# Patient Record
Sex: Male | Born: 1965 | Race: Black or African American | Hispanic: No | Marital: Single | Smoking: Current every day smoker
Health system: Southern US, Community
[De-identification: ages and names within clinical notes are randomized; demographics above are authoritative.]

## PROBLEM LIST (undated history)

## (undated) DIAGNOSIS — I10 Essential (primary) hypertension: Secondary | ICD-10-CM

---

## 1997-12-29 ENCOUNTER — Emergency Department (HOSPITAL_COMMUNITY): Admission: EM | Admit: 1997-12-29 | Discharge: 1997-12-29 | Payer: Self-pay | Admitting: Emergency Medicine

## 1998-08-21 ENCOUNTER — Encounter: Payer: Self-pay | Admitting: Emergency Medicine

## 1998-08-21 ENCOUNTER — Emergency Department (HOSPITAL_COMMUNITY): Admission: EM | Admit: 1998-08-21 | Discharge: 1998-08-21 | Payer: Self-pay | Admitting: Emergency Medicine

## 1998-09-19 ENCOUNTER — Emergency Department (HOSPITAL_COMMUNITY): Admission: EM | Admit: 1998-09-19 | Discharge: 1998-09-19 | Payer: Self-pay | Admitting: Emergency Medicine

## 1999-02-03 ENCOUNTER — Emergency Department (HOSPITAL_COMMUNITY): Admission: EM | Admit: 1999-02-03 | Discharge: 1999-02-03 | Payer: Self-pay | Admitting: Emergency Medicine

## 1999-06-10 ENCOUNTER — Emergency Department (HOSPITAL_COMMUNITY): Admission: EM | Admit: 1999-06-10 | Discharge: 1999-06-10 | Payer: Self-pay | Admitting: Emergency Medicine

## 1999-11-20 ENCOUNTER — Emergency Department (HOSPITAL_COMMUNITY): Admission: EM | Admit: 1999-11-20 | Discharge: 1999-11-20 | Payer: Self-pay | Admitting: Emergency Medicine

## 1999-11-26 ENCOUNTER — Emergency Department (HOSPITAL_COMMUNITY): Admission: EM | Admit: 1999-11-26 | Discharge: 1999-11-26 | Payer: Self-pay | Admitting: Emergency Medicine

## 2001-02-19 ENCOUNTER — Encounter: Payer: Self-pay | Admitting: Emergency Medicine

## 2001-02-19 ENCOUNTER — Emergency Department (HOSPITAL_COMMUNITY): Admission: EM | Admit: 2001-02-19 | Discharge: 2001-02-19 | Payer: Self-pay | Admitting: Emergency Medicine

## 2001-04-28 ENCOUNTER — Emergency Department (HOSPITAL_COMMUNITY): Admission: EM | Admit: 2001-04-28 | Discharge: 2001-04-28 | Payer: Self-pay

## 2004-01-02 ENCOUNTER — Emergency Department (HOSPITAL_COMMUNITY): Admission: EM | Admit: 2004-01-02 | Discharge: 2004-01-02 | Payer: Self-pay | Admitting: Emergency Medicine

## 2004-06-29 ENCOUNTER — Ambulatory Visit: Payer: Self-pay | Admitting: Nurse Practitioner

## 2004-07-29 ENCOUNTER — Ambulatory Visit: Payer: Self-pay | Admitting: *Deleted

## 2004-08-13 ENCOUNTER — Ambulatory Visit: Payer: Self-pay | Admitting: Nurse Practitioner

## 2004-08-24 ENCOUNTER — Ambulatory Visit: Payer: Self-pay | Admitting: Nurse Practitioner

## 2004-10-20 ENCOUNTER — Ambulatory Visit: Payer: Self-pay | Admitting: Nurse Practitioner

## 2004-10-28 ENCOUNTER — Ambulatory Visit: Payer: Self-pay | Admitting: Nurse Practitioner

## 2004-12-03 ENCOUNTER — Ambulatory Visit (HOSPITAL_COMMUNITY): Admission: RE | Admit: 2004-12-03 | Discharge: 2004-12-03 | Payer: Self-pay | Admitting: Internal Medicine

## 2004-12-16 ENCOUNTER — Ambulatory Visit: Payer: Self-pay | Admitting: Nurse Practitioner

## 2004-12-29 ENCOUNTER — Ambulatory Visit: Payer: Self-pay | Admitting: Nurse Practitioner

## 2004-12-30 ENCOUNTER — Ambulatory Visit (HOSPITAL_COMMUNITY): Admission: RE | Admit: 2004-12-30 | Discharge: 2004-12-30 | Payer: Self-pay | Admitting: Internal Medicine

## 2005-02-08 ENCOUNTER — Ambulatory Visit: Payer: Self-pay | Admitting: Nurse Practitioner

## 2005-04-12 ENCOUNTER — Ambulatory Visit: Payer: Self-pay | Admitting: Internal Medicine

## 2005-05-18 ENCOUNTER — Emergency Department (HOSPITAL_COMMUNITY): Admission: EM | Admit: 2005-05-18 | Discharge: 2005-05-18 | Payer: Self-pay | Admitting: Emergency Medicine

## 2005-06-14 ENCOUNTER — Ambulatory Visit: Payer: Self-pay | Admitting: Nurse Practitioner

## 2005-06-24 ENCOUNTER — Emergency Department (HOSPITAL_COMMUNITY): Admission: EM | Admit: 2005-06-24 | Discharge: 2005-06-24 | Payer: Self-pay | Admitting: Family Medicine

## 2005-08-11 ENCOUNTER — Emergency Department (HOSPITAL_COMMUNITY): Admission: EM | Admit: 2005-08-11 | Discharge: 2005-08-11 | Payer: Self-pay | Admitting: Family Medicine

## 2005-09-04 ENCOUNTER — Emergency Department (HOSPITAL_COMMUNITY): Admission: EM | Admit: 2005-09-04 | Discharge: 2005-09-04 | Payer: Self-pay | Admitting: Emergency Medicine

## 2005-10-07 ENCOUNTER — Emergency Department (HOSPITAL_COMMUNITY): Admission: EM | Admit: 2005-10-07 | Discharge: 2005-10-07 | Payer: Self-pay | Admitting: Family Medicine

## 2006-10-24 IMAGING — CR DG CHEST 2V
2 series · 2 of 2 positions shown · non-contrast
Comparison: none

CLINICAL DATA: Cough and shortness of breath

Chest 2 view:
No previous for comparison. Deformity of the right lateral thoracic cage. Lungs
clear. Heart size and pulmonary vascularity normal. No effusion.

[w chest pa]
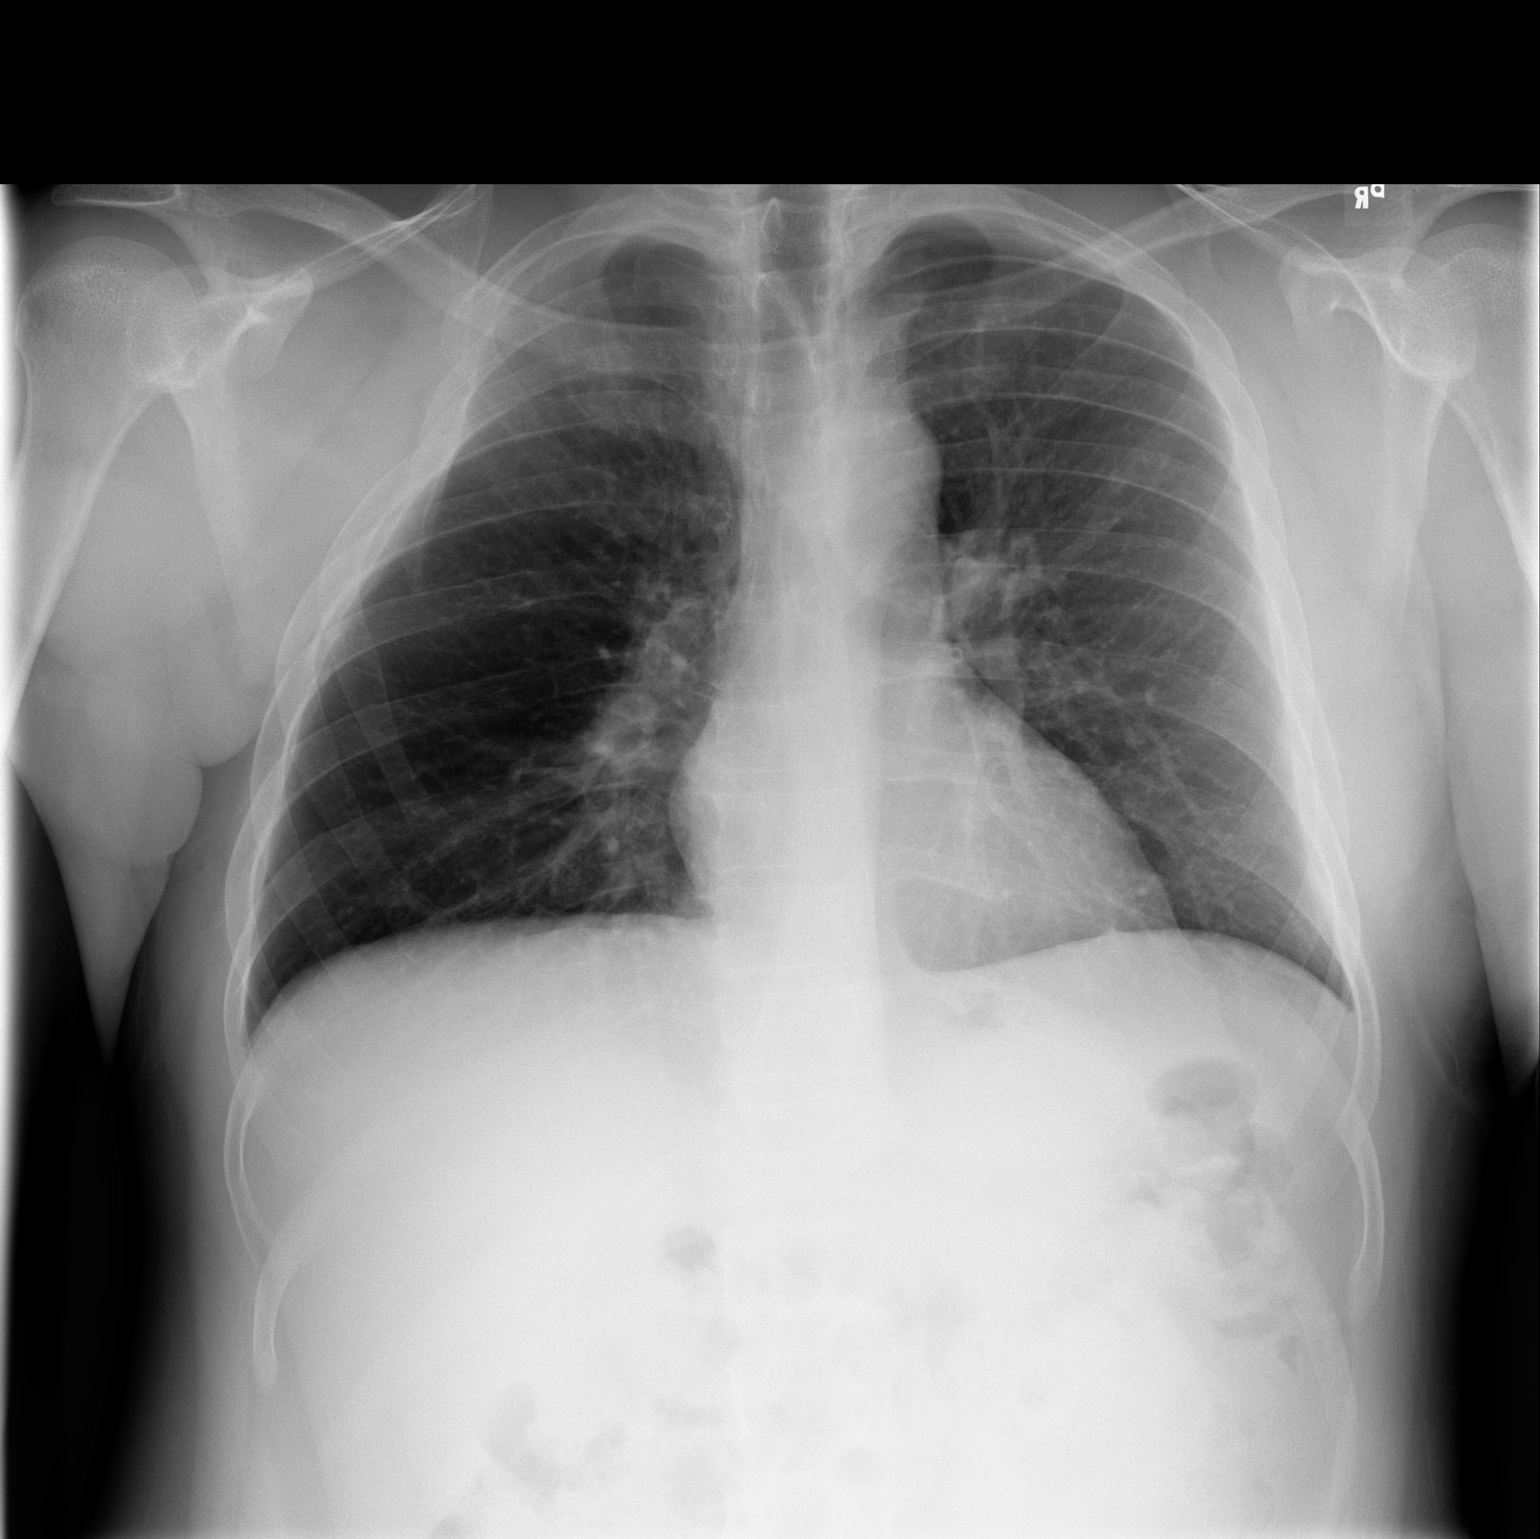

[w chest lat]
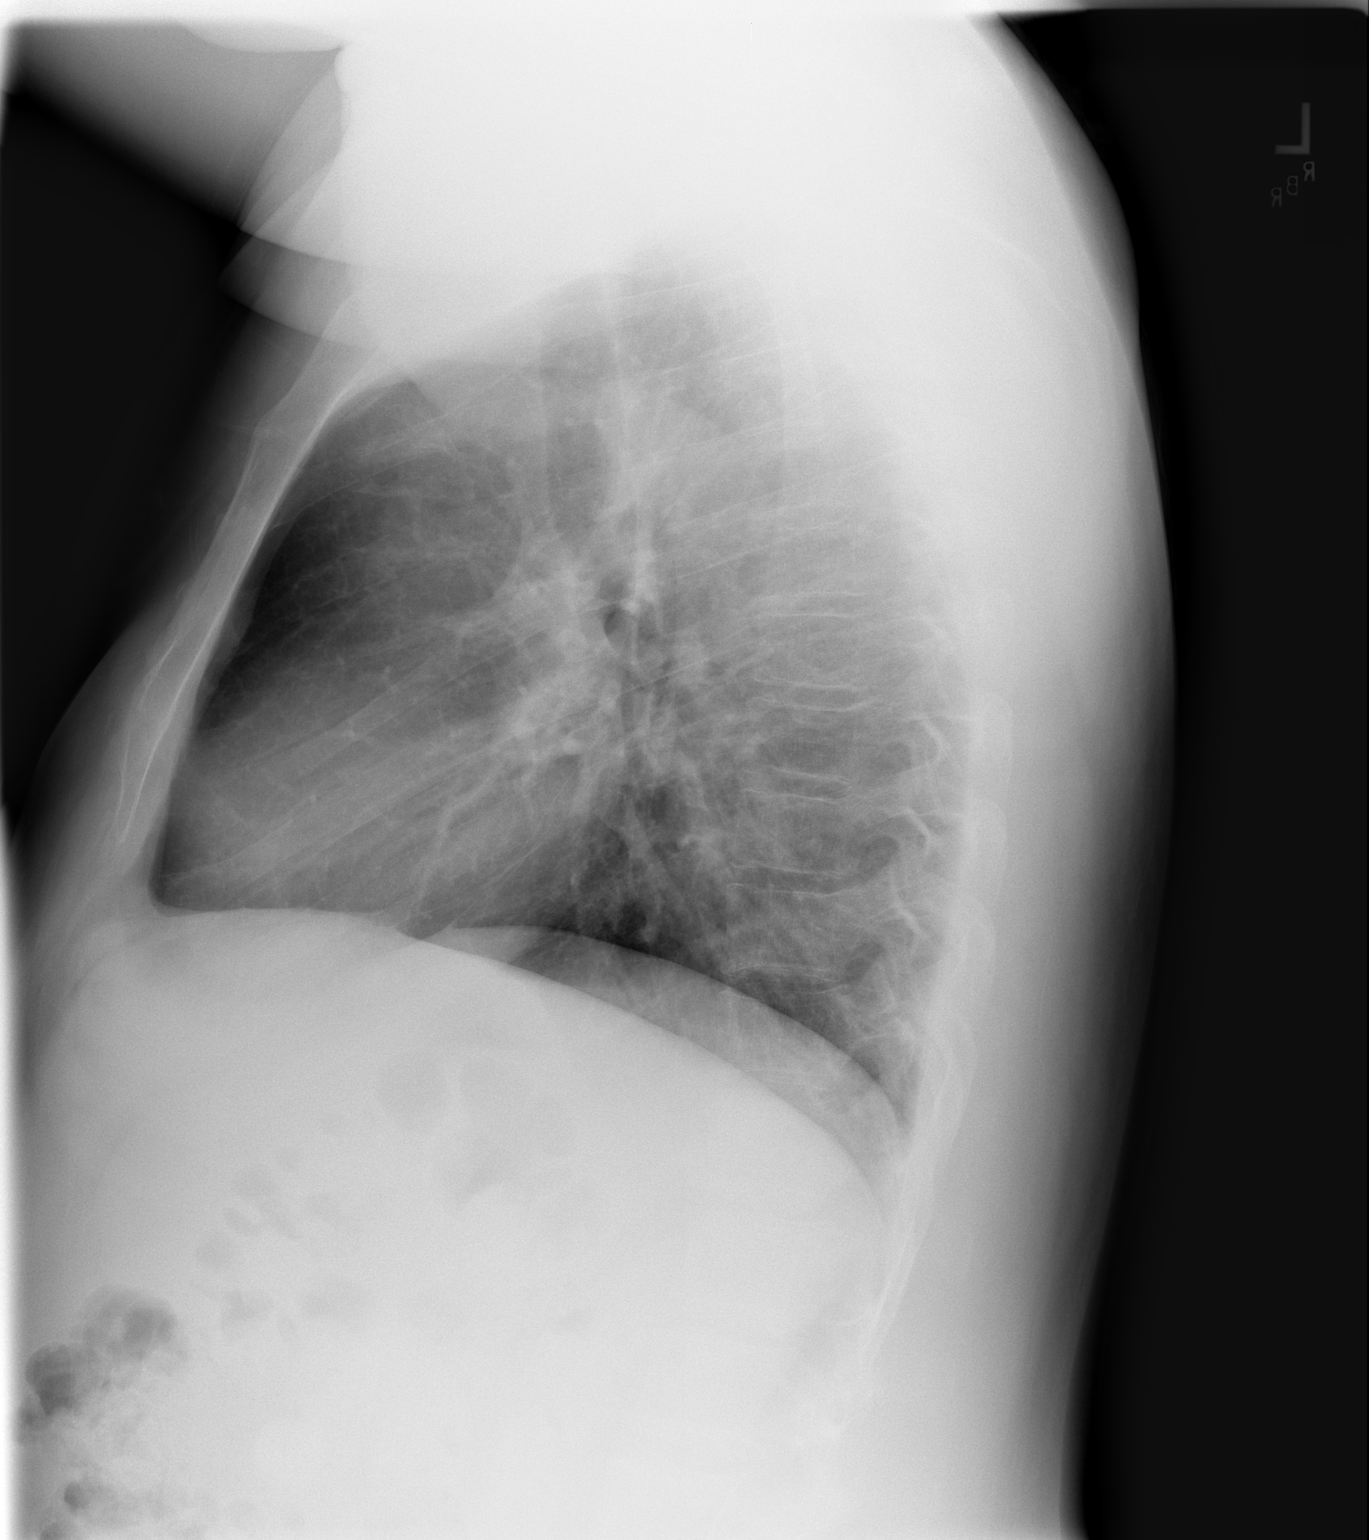

[2 of 2 positions shown; findings below may reference images not displayed]

IMPRESSION: 1. No acute disease

## 2007-09-23 ENCOUNTER — Emergency Department (HOSPITAL_COMMUNITY): Admission: EM | Admit: 2007-09-23 | Discharge: 2007-09-23 | Payer: Self-pay | Admitting: Family Medicine

## 2013-03-09 ENCOUNTER — Encounter (HOSPITAL_COMMUNITY): Payer: Self-pay | Admitting: *Deleted

## 2013-03-09 ENCOUNTER — Emergency Department (HOSPITAL_COMMUNITY)
Admission: EM | Admit: 2013-03-09 | Discharge: 2013-03-09 | Disposition: A | Discharge status: Home / Self Care | Payer: Self-pay | Attending: Emergency Medicine | Admitting: Emergency Medicine

## 2013-03-09 DIAGNOSIS — H109 Unspecified conjunctivitis: Secondary | ICD-10-CM | POA: Insufficient documentation

## 2013-03-09 DIAGNOSIS — F17200 Nicotine dependence, unspecified, uncomplicated: Secondary | ICD-10-CM | POA: Insufficient documentation

## 2013-03-09 MED ORDER — FLUORESCEIN SODIUM 1 MG OP STRP
1.0000 | ORAL_STRIP | Freq: Once | OPHTHALMIC | Status: DC
  Filled 2013-03-09: qty 1

## 2013-03-09 MED ORDER — ERYTHROMYCIN 5 MG/GM OP OINT: TOPICAL_OINTMENT | OPHTHALMIC | Status: DC

## 2013-03-09 MED ORDER — TETRACAINE HCL 0.5 % OP SOLN
1.0000 [drp] | Freq: Once | OPHTHALMIC | Status: AC
  Administered 2013-03-09: 1 [drp] via OPHTHALMIC
  Filled 2013-03-09: qty 2

## 2013-03-09 NOTE — ED Provider Notes (Signed)
CSN: [id number]     Arrival date & time 03/09/13  2140 History  This chart was scribed for non-physician practitioner, Mora Bellman, PA-C,working with Hurman Horn, MD, by Karle Plumber, ED Scribe.  This patient was seen in room TR06C/TR06C and the patient's care was started at 10:25 PM.    Chief Complaint  Patient presents with  . Conjunctivitis   The history is provided by the patient. No language interpreter was used.   HPI Comments:  Edward Golden is a 47 y.o. male who presents to the Emergency Department complaining of a red, irritated right eye onset three days. It has been gradually worsening. Pt states his left eye started experiencing the same symptoms today. Pt reports crusty drainage from both his eyes worse in the morning. He has not done anything to make the pain and itching better. He states it feels like he has rocks scraping his right eye. He does not wear glasses or contacts. No fevers, chills, double vision, blurry vision, floaters, pain with EOMs.   History reviewed. No pertinent past medical history. History reviewed. No pertinent past surgical history. History reviewed. No pertinent family history. History  Substance Use Topics  . Smoking status: Current Every Day Smoker  . Smokeless tobacco: Not on file  . Alcohol Use: Yes    Review of Systems  Eyes: Positive for discharge and redness. Negative for visual disturbance.  All other systems reviewed and are negative.    Allergies  Review of patient's allergies indicates no known allergies.  Home Medications  No current outpatient prescriptions on file. Triage Vitals: BP 169/97  Pulse 70  Temp(Src) 97.7 F (36.5 C) (Oral)  Resp 16  Ht 5\' 5"  (1.651 m)  Wt 177 lb 3 oz (80.372 kg)  BMI 29.49 kg/m2  SpO2 97% Physical Exam  Nursing note and vitals reviewed. Constitutional: He is oriented to person, place, and time. He appears well-developed and well-nourished. No distress.  HENT:  Head:  Normocephalic and atraumatic.  Right Ear: External ear normal.  Left Ear: External ear normal.  Nose: Nose normal.  Eyes: EOM and lids are normal. Pupils are equal, round, and reactive to light. Right conjunctiva is injected. Right conjunctiva has no hemorrhage. Left conjunctiva is injected. Left conjunctiva has no hemorrhage.  Slit lamp exam:      The right eye shows no corneal abrasion, no corneal flare, no corneal ulcer, no foreign body, no hyphema, no hypopyon and no fluorescein uptake.       The left eye shows no corneal abrasion, no corneal flare, no corneal ulcer, no foreign body, no hyphema, no hypopyon and no fluorescein uptake.  Injected conjunctivae bilaterally. No fluorescein dye uptake, no photophobia, no pain with consensual light reflex.   Neck: Normal range of motion. No tracheal deviation present.  Cardiovascular: Normal rate, regular rhythm and normal heart sounds.   Pulmonary/Chest: Effort normal and breath sounds normal. No stridor.  Abdominal: Soft. He exhibits no distension. There is no tenderness.  Musculoskeletal: Normal range of motion.  Neurological: He is alert and oriented to person, place, and time.  Skin: Skin is warm and dry. He is not diaphoretic.  Psychiatric: He has a normal mood and affect. His behavior is normal.    ED Course  Procedures (including critical care time) DIAGNOSTIC STUDIES: Oxygen Saturation is 97% on RA, normal by my interpretation.   COORDINATION OF CARE: 10:32 PM- Will check for corneal abrasion and treat for conjunctivitis. Pt verbalizes understanding and agrees  to plan.  Medications - No data to display  Labs Review Labs Reviewed - No data to display Imaging Review No results found.  MDM   1. Conjunctivitis    Patient presentation consistent with conjunctivitis.  No purulent discharge, corneal abrasions, entrapment, consensual photophobia, or dendritic staining with fluorescein study.  Presentation non-concerning for iritis,  corneal abrasions, or HSV.  Patient was discharged with erythromycin ointment.  Personal hygiene and frequent handwashing discussed.  Patient advised to followup with ophthalmologist if symptoms persist or worsen in any way including vision change or purulent discharge.  Patient verbalizes understanding and is agreeable with discharge.   I personally performed the services described in this documentation, which was scribed in my presence. The recorded information has been reviewed and is accurate.     Mora Bellman, PA-C 03/09/13 2357

## 2013-03-09 NOTE — ED Notes (Signed)
Pt states that his right eye was pink an irritated for 3 days, then it moved his left. Pt states crust and drainage as well. Pt states that he thought it was allergies but now believes that it has been too long for allergies.

## 2013-03-10 NOTE — ED Provider Notes (Signed)
Medical screening examination/treatment/procedure(s) were performed by non-physician practitioner and as supervising physician I was immediately available for consultation/collaboration.   Margo Lama M Petrice Beedy, MD 03/10/13 1237 

## 2013-10-17 ENCOUNTER — Emergency Department (HOSPITAL_COMMUNITY)
Admission: EM | Admit: 2013-10-17 | Discharge: 2013-10-17 | Disposition: A | Discharge status: Home / Self Care | Payer: Self-pay | Attending: Emergency Medicine | Admitting: Emergency Medicine

## 2013-10-17 DIAGNOSIS — I10 Essential (primary) hypertension: Secondary | ICD-10-CM | POA: Insufficient documentation

## 2013-10-17 DIAGNOSIS — H579 Unspecified disorder of eye and adnexa: Secondary | ICD-10-CM | POA: Insufficient documentation

## 2013-10-17 DIAGNOSIS — F17200 Nicotine dependence, unspecified, uncomplicated: Secondary | ICD-10-CM | POA: Insufficient documentation

## 2013-10-17 DIAGNOSIS — L259 Unspecified contact dermatitis, unspecified cause: Secondary | ICD-10-CM | POA: Insufficient documentation

## 2013-10-17 MED ORDER — DEXAMETHASONE SODIUM PHOSPHATE 10 MG/ML IJ SOLN
10.0000 mg | Freq: Once | INTRAMUSCULAR | Status: AC
  Administered 2013-10-17: 10 mg via INTRAMUSCULAR
  Filled 2013-10-17: qty 1

## 2013-10-17 MED ORDER — CLOBETASOL PROPIONATE 0.05 % EX CREA: 1.0000 "application " | TOPICAL_CREAM | Freq: Two times a day (BID) | CUTANEOUS | Status: AC

## 2013-10-17 MED ORDER — FLUORESCEIN SODIUM 1 MG OP STRP
1.0000 | ORAL_STRIP | Freq: Once | OPHTHALMIC | Status: AC
  Administered 2013-10-17: 1 via OPHTHALMIC
  Filled 2013-10-17: qty 1

## 2013-10-17 MED ORDER — TETRACAINE HCL 0.5 % OP SOLN
2.0000 [drp] | Freq: Once | OPHTHALMIC | Status: AC
  Administered 2013-10-17: 2 [drp] via OPHTHALMIC
  Filled 2013-10-17: qty 2

## 2013-10-17 MED ORDER — PREDNISONE 20 MG PO TABS: ORAL_TABLET | ORAL | Status: DC

## 2013-10-17 NOTE — ED Provider Notes (Signed)
CSN: [id number]     Arrival date & time 10/17/13  [id number] History   This chart was scribed for non-physician practitioner Dierdre ForthHannah Noboru Bidinger, PA-C, working with Hurman HornJohn M Bednar, MD, by Yevette EdwardsAngela Bracken, ED Scribe. This patient was seen in room TR05C/TR05C and the patient's care was started at 8:26 PM.  First MD Initiated Contact with Patient 10/17/13 2011     Chief Complaint  Patient presents with  . Rash  . Itchy Eye    The history is provided by the patient. No language interpreter was used.   HPI Comments Edward Golden is a 48 y.o. male who presents to the Emergency Department complaining of a rash to his face and bilateral arms which he first noticed two days ago. The pt works in [employer], and he states he may have been exposed to poison ivy. He states the rash began on his arms, and may have been transferred to his face via the handkerchief he utilizes at work. The pt characterizes the rash as "bumps." He also reports rhinorrhea and swelling to his eyes upon awakening, though he denies itching to his eyes. The pt has used Benadryl, coco butter, and shea butter without resolution. He denies a prior history of symptoms. He also denies fever, chills, SOB, and difficulty swallowing. He denies a h/o medical issues or daily medications.   No past medical history on file. No past surgical history on file. No family history on file. History  Substance Use Topics  . Smoking status: Current Every Day Smoker  . Smokeless tobacco: Not on file  . Alcohol Use: Yes    Review of Systems  Constitutional: Negative for fever and chills.  HENT: Positive for rhinorrhea. Negative for trouble swallowing.   Eyes: Negative for itching.  Respiratory: Negative for shortness of breath.   Skin: Positive for rash.  All other systems reviewed and are negative.   Allergies  Review of patient's allergies indicates no known allergies.  Home Medications   Prior to Admission medications   Medication Sig  Start Date End Date Taking? Authorizing Provider  erythromycin ophthalmic ointment Place a 1/2 inch ribbon of ointment into the lower eyelid. 03/09/13   Mora BellmanHannah S Merrell, PA-C   Triage Vitals: BP 174/109  Pulse 78  Temp(Src) 98.3 F (36.8 C) (Oral)  Resp 18  SpO2 98%  Physical Exam  Nursing note and vitals reviewed. Constitutional: He appears well-developed and well-nourished. No distress.  Awake, alert, nontoxic appearance  HENT:  Head: Normocephalic and atraumatic.  Mouth/Throat: Oropharynx is clear and moist. No oropharyngeal exudate.  Eyes: Conjunctivae are normal. No scleral icterus.  No flourescein uptake. No ulcers, abrasions, or dendritic lesions.   Neck: Normal range of motion. Neck supple.  Cardiovascular: Normal rate, regular rhythm and intact distal pulses.   Pulmonary/Chest: Effort normal and breath sounds normal. No respiratory distress. He has no wheezes.  Abdominal: Soft. Bowel sounds are normal. He exhibits no mass. There is no tenderness. There is no rebound and no guarding.  Musculoskeletal: Normal range of motion. He exhibits no edema.  Neurological: He is alert.  Speech is clear and goal oriented Moves extremities without ataxia  Skin: Skin is warm and dry. Rash noted. He is not diaphoretic. There is erythema.  Erythematous, macular, raised and excoriated rash to the face, forehead, nose, cheeks, bilateral neck and bilateral forearms; vesicles beginning to form and coalesce on the left flexural fold  No induration or evidence of abscess  Psychiatric: He has a normal mood and  affect.    ED Course  Procedures (including critical care time)  DIAGNOSTIC STUDIES: Oxygen Saturation is 98% on room air, normal by my interpretation.    COORDINATION OF CARE:  8:32 PM- Discussed treatment plan with patient, and the patient agreed to the plan. The plan includes a course of steroids, both oral and IM, and a fluorescein procedure.   9:12 PM- Performed fluorescein.    Labs Review Labs Reviewed - No data to display  Imaging Review No results found.   EKG Interpretation None      MDM   Final diagnoses:  Contact dermatitis  HTN (hypertension)   Edward Golden sense with rash to the face and arms consistent with contact dermatitis likely poison ivy.  No lesions to the globes, no evidence of spread into the eye.  Pt presentation consistant with poison ivy infection. Discussed contagiousness & home care. Treated in ED w decadron. DC with recommendation to get OTC Zanfel. Script for Clobetasol propionate given and PO prednisone taper. Strict return precautions discussed. Pt afebrile and in NAD prior to dc. Airway intact without compromise. No genital involvement of rash. facial involvement however no evidence of rash in the eyes.  Patient instructed to return to the emergency department if rash moves into his eyes, he develops fever or has difficulty breathing.  Patient noted to be hypertensive in the emergency department.  No signs of hypertensive urgency.  Discussed with patient the need for close follow-up and management by their primary care physician.   It has been determined that no acute conditions requiring further emergency intervention are present at this time. The patient/guardian have been advised of the diagnosis and plan. We have discussed signs and symptoms that warrant return to the ED, such as changes or worsening in symptoms.   Vital signs are stable at discharge.   BP 170/114  Pulse 64  Temp(Src) 98.3 F (36.8 C) (Oral)  Resp 16  SpO2 97%  Patient/guardian has voiced understanding and agreed to follow-up with the PCP or specialist.  I personally performed the services described in this documentation, which was scribed in my presence. The recorded information has been reviewed and is accurate.    Dahlia ClientHannah Paisleigh Maroney, PA-C 10/17/13 2146

## 2013-10-17 NOTE — Discharge Instructions (Signed)
1. Medications: clobetasol, prednisone, usual home medications 2. Treatment: rest, drink plenty of fluids,  3. Follow Up: Please followup with your primary doctor for discussion of your diagnoses and further evaluation after today's visit; if you do not have a primary care doctor use the resource guide provided to find one;    Poison [facility] Poison ivy is a inflammation of the skin (contact dermatitis) caused by touching the allergens on the leaves of the ivy plant following previous exposure to the plant. The rash usually appears 48 hours after exposure. The rash is usually bumps (papules) or blisters (vesicles) in a linear pattern. Depending on your own sensitivity, the rash may simply cause redness and itching, or it may also progress to blisters which may break open. These must be well cared for to prevent secondary bacterial (germ) infection, followed by scarring. Keep any open areas dry, clean, dressed, and covered with an antibacterial ointment if needed. The eyes may also get puffy. The puffiness is worst in the morning and gets better as the day progresses. This dermatitis usually heals without scarring, within 2 to 3 weeks without treatment. HOME CARE INSTRUCTIONS  Thoroughly wash with soap and water as soon as you have been exposed to poison ivy. You have about one half hour to remove the plant resin before it will cause the rash. This washing will destroy the oil or antigen on the skin that is causing, or will cause, the rash. Be sure to wash under your fingernails as any plant resin there will continue to spread the rash. Do not rub skin vigorously when washing affected area. Poison ivy cannot spread if no oil from the plant remains on your body. A rash that has progressed to weeping sores will not spread the rash unless you have not washed thoroughly. It is also important to wash any clothes you have been wearing as these may carry active allergens. The rash will return if you wear the unwashed  clothing, even several days later. Avoidance of the plant in the future is the best measure. Poison ivy plant can be recognized by the number of leaves. Generally, poison ivy has three leaves with flowering branches on a single stem. Diphenhydramine may be purchased over the counter and used as needed for itching. Do not drive with this medication if it makes you drowsy.Ask your caregiver about medication for children. SEEK MEDICAL CARE IF:  Open sores develop.  Redness spreads beyond area of rash.  You notice purulent (pus-like) discharge.  You have increased pain.  Other signs of infection develop (such as fever). Document Released: 05/20/2000 Document Revised: 08/15/2011 Document Reviewed: 04/08/2009 [facility] Patient Information 2014 [employer].    Emergency Department Resource Guide 1) Find a Doctor and Pay Out of Pocket Although you won't have to find out who is covered by your insurance plan, it is a good idea to ask around and get recommendations. You will then need to call the office and see if the doctor you have chosen will accept you as a new patient and what types of options they offer for patients who are self-pay. Some doctors offer discounts or will set up payment plans for their patients who do not have insurance, but you will need to ask so you aren't surprised when you get to your appointment.  2) Contact Your Local Health Department Not all health departments have doctors that can see patients for sick visits, but many do, so it is worth a call to see if yours  does. If you don't know where your local health department is, you can check in your phone book. The CDC also has a tool to help you locate your state's health department, and many state websites also have listings of all of their local health departments.  3) Find a Walk-in Clinic If your illness is not likely to be very severe or complicated, you may want to try a walk in clinic. These are popping up all  over the country in pharmacies, drugstores, and shopping centers. They're usually staffed by nurse practitioners or physician assistants that have been trained to treat common illnesses and complaints. They're usually fairly quick and inexpensive. However, if you have serious medical issues or chronic medical problems, these are probably not your best option.  No Primary Care Doctor: - Call Health Connect at  [phone] - they can help you locate a primary care doctor that  accepts your insurance, provides certain services, etc. - Physician Referral Service- [phone]  Chronic Pain Problems: Organization         Address  Phone   Notes  [facility]  [phone]) [phone] Patients need to be referred by their primary care doctor.   Medication Assistance: Organization         Address  Phone   Notes  [facility] Medication [facility] [address] [place], [place] [address] [phone]) [phone] --Must be a resident of [place] -- Must have NO insurance coverage whatsoever (no Medicaid/ Medicare, etc.) -- The pt. MUST have a primary care doctor that directs their care regularly and follows them in the community   [facility]ist  [phone]   [employer]  [phone]) [phone]    Agencies that provide inexpensive medical care: Organization         Address  Phone   Notes  [facility]  [phone]   [facility]    [phone]) [phone]   [facility] [address] [place], [place] [address] [phone]   [facility] [address], [place] [phone]) [phone]   [facility]anned Parenthood    [phone]) [phone]   [facility]    [phone](336) [phone]   [facility]  [address], [place] Phone:  [phone], Fax:  [phone] Hours of Operation:  9 am - 6 pm, M-F.  Also accepts Medicaid/Medicare and self-pay.  [facility]  [address], [place] Phone: [phone](336) [phone], Fax: [phone]. Hours of Operation:  8:30 am - 5:30 pm, M-F.  Also accepts Medicaid and self-pay.  [facility] [place] [address] [place] [address], [place] Phone: [phone]) [phone]   [facility] [address], [place], [place] [phone], Ext. [phone] Mondays & Thursdays: 7-9 AM.  First 15 patients are seen on a first come, first serve basis.    Medicaid-accepting [facility] Providers:  Organization         Address  Phone   Notes  [facility] [address], [place] [phone]) [phone] Also accepts self-pay patients.  [facility] [address], [place]  [phone]) [phone]   [facility] [address], [place] [phone]) [phone]   [facility] [address] [place] [address], [place] [phone]) [phone]   Renaye RakersVeita Bland [address], [place]   [phone]  Only accepts [place] patients after they have their name applied to their card.   Self-Pay (no insurance) in [facility]:  Organization         Address  Phone   Notes  Sickle Cell Patients, [facility] [address] [place], [place] [phone]   [facility] [address] [place], [place] [phone]   [facility] [place]  [address], [place] [phone]   [facility]/Dr. Osei-Bonsu  [address] [place] or [address], [place] [phone] Phone number for both [place] and [place] locations is the same.  [facility] [address] [place] [phone]   [facility] [address] [place] or [address] [phone]   [facility] [address], [place] [phone], phone; [phone], fax Sees patients 1st and 3rd Saturday of every  month.  Must not qualify for public or private insurance (i.e. Medicaid, Medicare, [place] Health Choice, Veterans' Benefits)  Household income should be no more than 200% of the poverty level The clinic cannot treat you if you are pregnant or think you are pregnant  Sexually transmitted diseases are not treated at the clinic.    Dental Care: Organization         Address  Phone  Notes  [facility] [address] [place], [place] [phone] Accepts children up to age 23 who are enrolled in [place] or [place] Health Choice; pregnant women with a Medicaid card; and children who have applied for Medicaid or [place] Health Choice, but were declined, whose parents can pay a reduced fee at time of service.  [facility]  [address], [place] [phone] Accepts children up to age 1 who are enrolled in [place] or [place] Health Choice; pregnant women with a Medicaid card; and children who have applied for Medicaid or  Health Choice, but were declined, whose parents can pay a reduced fee at time of service.  [facility]  [address] [place], [place] [phone] Patients are seen by appointment only. Walk-ins are not accepted. [facility] will see patients 18 years of age and older. Monday - Tuesday (8am-5pm) Most Wednesdays (8:30-5pm) $30 per visit, cash only  [facility]  [address], [facility] [phone] Patients are seen by appointment only. Walk-ins are not accepted. [facility] will see patients 79 years of age and older. One Wednesday Evening (Monthly: Volunteer Based).  $30 per visit, cash only  [employer]  [phone] for adults; Children under age 39, call Graduate Pediatric Dentistry at [phone]. Children aged 10-14, please call [phone] to request a pediatric application.  Dental  services are provided in all areas of dental care including fillings, crowns and bridges, complete and partial dentures, implants, gum treatment, root canals, and extractions. Preventive care is also provided. Treatment is provided to both adults and children. Patients are selected via a lottery and there is often a waiting list.   [facility] [address] [place]  [phone] [email or web address]   [facility] [address] [place], [place] [phone], Ext. [phone] Second and Fourth Thursday of each month, opens at 6:30 AM; Clinic  ends at 9 AM.  Patients are seen on a first-come first-served basis, and a limited number are seen during each clinic.   [facility]  [address] Ether Griffins [place], [place] [phone]   Eligibility Requirements You must have lived in [place], [place], or [place] counties for at least the last three months.   You cannot be eligible for state or federal sponsored [place], including [employer], [place], or [employer].   You generally cannot be eligible for healthcare insurance through your employer.    How to apply: Eligibility screenings are held every Tuesday and Wednesday afternoon from 1:00 pm until 4:00 pm. You do not need an appointment for the interview!  [facility] [address] [place], [place] [phone]   [facility]  [phone]   [facility]  [phone]   [facility] Health Department  [phone]    Behavioral Health Resources in the Community: Intensive Outpatient Programs Organization         Address  Phone  Notes  [facility] [address], [place], [place] [phone]   [facility] [address], [place], [place] [phone]   [facility] [address], [place], [place]  [phone]   [facility] [address],    [place], [place] [phone] or [phone]   Substance Abuse Resources Organization         Address  Phone  Notes  [facility]  [phone]   [facility]  [phone]   [facility]  [phone]   Floydene Flock  [phone]   Residential & Outpatient Substance Abuse Program  [phone]   Psychological Services Organization         Address  Phone  Notes  [facility]  [phone]   [facility]  [phone]   [facility] Mental Health [address], [place] [phone] or [phone]    Mobile Crisis Teams Organization         Address  Phone  Notes  [facility], Mobile Crisis Care Unit  [phone]   [facility]  [address] [place], [place] [phone]   Doristine Locks [address] [place] [phone]    Self-Help/Support Groups Organization         Address  Phone             Notes  [facility] [place] - variety of support groups  336- I7437963 Call for more information  Narcotics Anonymous (NA), [facility] [address], [employer] [place]  2 meetings at this location   Statistician         Address  Phone  Notes  [facility] [address],    [place]  [phone]   [facility]  [address] [place] [address], [place], [place] [phone]   [facility] [address] [place], [place] [phone] Admissions: 8am-3pm M-F  [facility] [address],    [place], [place] [phone]   [facility] [address] [place]s [place], [place] [phone]   [facility] [address],  [place], [place] [phone]   [facility] - Intensive Outpatient [address], [place], [place] [phone]   [facility] ([facility] [address] [place], [place]  [phone] or [phone]  [facility] ([facility]) [address], [place], [place] [phone] Accepts Medicaid  [facility] [address]  [place] [phone] Substance Abuse/Addiction Treatment   [facility] Organization         Address  Phone  Notes  [facility]  [phone]   Angie Fava, PhD [address] Ervin Knack [place], [place]   [phone] or [phone]   [facility]   [address] [place], [place] [phone]   [facility] [address] [place], [place] [phone] Insurance/Medicaid/sponsorship through [facility] and Families [address]                                    [place], [place] [phone] Therapy/tele-psych/case  [facility] [address], [place] [phone]    Dr. Lolly Mustache  [phone]   [facility] [place]  [employer] [facility] Dept. 1) [address] [place] 2) [address] [place] 3)  [address], [place] [phone]  [phone]   [facility] Child Abuse Hotline (201)100-4376 or [phone] (After Hours)

## 2013-10-17 NOTE — ED Notes (Signed)
Pt c/o rash on his arms and face. Pt states he works [employer] and may have been around poison ivy.

## 2013-10-17 NOTE — ED Notes (Signed)
MD cleared patient to go with blood pressures, pt to follow up with primary care.

## 2013-10-17 NOTE — ED Notes (Addendum)
Pt states he woke up with runny nose and rash after working outside in the yard X 2days now. Rash to face and bilateral arms.

## 2013-10-18 NOTE — ED Provider Notes (Signed)
Medical screening examination/treatment/procedure(s) were performed by non-physician practitioner and as supervising physician I was immediately available for consultation/collaboration.   EKG Interpretation None       Hurman HornJohn M Athony Coppa, MD 10/18/13 2115

## 2014-11-13 ENCOUNTER — Emergency Department (HOSPITAL_COMMUNITY)
Admission: EM | Admit: 2014-11-13 | Discharge: 2014-11-13 | Disposition: A | Discharge status: Home / Self Care | Payer: Self-pay | Attending: Emergency Medicine | Admitting: Emergency Medicine

## 2014-11-13 ENCOUNTER — Encounter (HOSPITAL_COMMUNITY): Payer: Self-pay | Admitting: *Deleted

## 2014-11-13 DIAGNOSIS — Z720 Tobacco use: Secondary | ICD-10-CM | POA: Insufficient documentation

## 2014-11-13 DIAGNOSIS — I10 Essential (primary) hypertension: Secondary | ICD-10-CM | POA: Insufficient documentation

## 2014-11-13 HISTORY — DX: Essential (primary) hypertension: I10

## 2014-11-13 LAB — I-STAT CHEM 8, ED
BUN: 17 mg/dL (ref 6–20)
CALCIUM ION: 1.07 mmol/L — AB (ref 1.12–1.23)
Chloride: 102 mmol/L (ref 101–111)
Creatinine, Ser: 1.1 mg/dL (ref 0.61–1.24)
Glucose, Bld: 108 mg/dL — ABNORMAL HIGH (ref 65–99)
HCT: 43 % (ref 39.0–52.0)
Hemoglobin: 14.6 g/dL (ref 13.0–17.0)
Potassium: 3.4 mmol/L — ABNORMAL LOW (ref 3.5–5.1)
SODIUM: 137 mmol/L (ref 135–145)
TCO2: 20 mmol/L (ref 0–100)

## 2014-11-13 MED ORDER — HYDROCHLOROTHIAZIDE 25 MG PO TABS: 25.0000 mg | ORAL_TABLET | Freq: Every day | ORAL | Status: AC

## 2014-11-13 NOTE — ED Provider Notes (Addendum)
CSN: [id number]     Arrival date & time 11/13/14  0831 History   First MD Initiated Contact with Patient 11/13/14 [phone]     Chief Complaint  Patient presents with  . Hypertension     (Consider location/radiation/quality/duration/timing/severity/associated sxs/prior Treatment) Patient is a 49 y.o. male presenting with hypertension. The history is provided by the patient.  Hypertension This is a recurrent problem. Episode onset: 6 months. The problem occurs constantly. The problem has been gradually worsening. Pertinent negatives include no chest pain, no abdominal pain and no shortness of breath. Associated symptoms comments: occassional head pressure but not today. Nothing aggravates the symptoms. Nothing relieves the symptoms. He has tried nothing (was on HCTZ about 10 years ago but BP improved after loosing weight) for the symptoms. The treatment provided no relief.    Past Medical History  Diagnosis Date  . Hypertension    History reviewed. No pertinent past surgical history. No family history on file. History  Substance Use Topics  . Smoking status: Current Every Day Smoker -- 0.50 packs/day    Types: Cigarettes  . Smokeless tobacco: Not on file  . Alcohol Use: Yes    Review of Systems  Respiratory: Negative for shortness of breath.   Cardiovascular: Negative for chest pain.  Gastrointestinal: Negative for abdominal pain.  All other systems reviewed and are negative.     Allergies  Review of patient's allergies indicates no known allergies.  Home Medications   Prior to Admission medications   Medication Sig Start Date End Date Taking? Authorizing Provider  clobetasol cream (TEMOVATE) 0.05 % Apply 1 application topically 2 (two) times daily. Patient not taking: Reported on 11/13/2014 10/17/13   Dahlia Client Muthersbaugh, PA-C  predniSONE (DELTASONE) 20 MG tablet 3 tabs po daily x 3 days, then 2 tabs x 3 days, then 1.5 tabs x 3 days, then 1 tab x 3 days, then 0.5 tabs x 3  days Patient not taking: Reported on 11/13/2014 10/17/13   Dahlia Client Muthersbaugh, PA-C   BP 179/127 mmHg  Pulse 61  Temp(Src) 98 F (36.7 C) (Oral)  Resp 16  Ht 5' 5.5" (1.664 m)  Wt 178 lb (80.74 kg)  BMI 29.16 kg/m2  SpO2 99% Physical Exam  Constitutional: He is oriented to person, place, and time. He appears well-developed and well-nourished. No distress.  HENT:  Head: Normocephalic and atraumatic.  Mouth/Throat: Oropharynx is clear and moist.  Eyes: Conjunctivae and EOM are normal. Pupils are equal, round, and reactive to light.  Neck: Normal range of motion. Neck supple.  Cardiovascular: Normal rate, regular rhythm and intact distal pulses.   No murmur heard. Pulmonary/Chest: Effort normal and breath sounds normal. No respiratory distress. He has no wheezes. He has no rales.  Abdominal: Soft. He exhibits no distension. There is no tenderness. There is no rebound and no guarding.  Musculoskeletal: Normal range of motion. He exhibits no edema or tenderness.  Neurological: He is alert and oriented to person, place, and time.  Skin: Skin is warm and dry. No rash noted. No erythema.  Psychiatric: He has a normal mood and affect. His behavior is normal.  Nursing note and vitals reviewed.   ED Course  Procedures (including critical care time) Labs Review Labs Reviewed  I-STAT CHEM 8, ED - Abnormal; Notable for the following:    Potassium 3.4 (*)    Glucose, Bld 108 (*)    Calcium, Ion 1.07 (*)    All other components within normal limits    Imaging Review  No results found.   EKG Interpretation None      MDM   Final diagnoses:  Essential hypertension    Patient presents today with asymptomatic hypertension. History of hypertension approximately 10 years ago for which he took HCTZ but states he lost a significant amount of weight and his blood pressure improved. For the last 6 months he's been checking his blood pressure at his dad's house as well as when he tries to  donate plasma and it is remained elevated. He denies any chest pain or shortness of breath. Occasionally he will get pressure in his head but denies true headache. No focal weakness.  Blood pressure today was 179/127 and on repeat was 152/117. Discussed with patient a low-salt diet, smoke cessation and will start HCTZ if i-STAT Chem-8 is within normal limits.  10:09 AM Istat wnl.  Pt d/ced home with hctz  Gwyneth Sprout, MD 11/13/14 1010  Gwyneth Sprout, MD 11/13/14 1012

## 2014-11-13 NOTE — Discharge Instructions (Signed)
Hypertension Hypertension, commonly called high blood pressure, is when the force of blood pumping through your arteries is too strong. Your arteries are the blood vessels that carry blood from your heart throughout your body. A blood pressure reading consists of a higher number over a lower number, such as 110/72. The higher number (systolic) is the pressure inside your arteries when your heart pumps. The lower number (diastolic) is the pressure inside your arteries when your heart relaxes. Ideally you want your blood pressure below 120/80. Hypertension forces your heart to work harder to pump blood. Your arteries may become narrow or stiff. Having hypertension puts you at risk for heart disease, stroke, and other problems.  RISK FACTORS Some risk factors for high blood pressure are controllable. Others are not.  Risk factors you cannot control include:   Race. You may be at higher risk if you are African American.  Age. Risk increases with age.  Gender. Men are at higher risk than women before age 45 years. After age 65, women are at higher risk than men. Risk factors you can control include:  Not getting enough exercise or physical activity.  Being overweight.  Getting too much fat, sugar, calories, or salt in your diet.  Drinking too much alcohol. SIGNS AND SYMPTOMS Hypertension does not usually cause signs or symptoms. Extremely high blood pressure (hypertensive crisis) may cause headache, anxiety, shortness of breath, and nosebleed. DIAGNOSIS  To check if you have hypertension, your health care provider will measure your blood pressure while you are seated, with your arm held at the level of your heart. It should be measured at least twice using the same arm. Certain conditions can cause a difference in blood pressure between your right and left arms. A blood pressure reading that is higher than normal on one occasion does not mean that you need treatment. If one blood pressure reading  is high, ask your health care provider about having it checked again. TREATMENT  Treating high blood pressure includes making lifestyle changes and possibly taking medicine. Living a healthy lifestyle can help lower high blood pressure. You may need to change some of your habits. Lifestyle changes may include:  Following the DASH diet. This diet is high in fruits, vegetables, and whole grains. It is low in salt, red meat, and added sugars.  Getting at least 2 hours of brisk physical activity every week.  Losing weight if necessary.  Not smoking.  Limiting alcoholic beverages.  Learning ways to reduce stress. If lifestyle changes are not enough to get your blood pressure under control, your health care provider may prescribe medicine. You may need to take more than one. Work closely with your health care provider to understand the risks and benefits. HOME CARE INSTRUCTIONS  Have your blood pressure rechecked as directed by your health care provider.   Take medicines only as directed by your health care provider. Follow the directions carefully. Blood pressure medicines must be taken as prescribed. The medicine does not work as well when you skip doses. Skipping doses also puts you at risk for problems.   Do not smoke.   Monitor your blood pressure at home as directed by your health care provider. SEEK MEDICAL CARE IF:   You think you are having a reaction to medicines taken.  You have recurrent headaches or feel dizzy.  You have swelling in your ankles.  You have trouble with your vision. SEEK IMMEDIATE MEDICAL CARE IF:  You develop a severe headache or confusion.    You have unusual weakness, numbness, or feel faint.  You have severe chest or abdominal pain.  You vomit repeatedly.  You have trouble breathing. MAKE SURE YOU:   Understand these instructions.  Will watch your condition.  Will get help right away if you are not doing well or get worse. Document  Released: 05/23/2005 Document Revised: 10/07/2013 Document Reviewed: 03/15/2013 ExitCare Patient Information 2015 [employer]. This information is not intended to replace advice given to you by your health care provider. Make sure you discuss any questions you have with your health care provider. DASH Eating Plan DASH stands for "Dietary Approaches to Stop Hypertension." The DASH eating plan is a healthy eating plan that has been shown to reduce high blood pressure (hypertension). Additional health benefits may include reducing the risk of type 2 diabetes mellitus, heart disease, and stroke. The DASH eating plan may also help with weight loss. WHAT DO I NEED TO KNOW ABOUT THE DASH EATING PLAN? For the DASH eating plan, you will follow these general guidelines:  Choose foods with a percent daily value for sodium of less than 5% (as listed on the food label).  Use salt-free seasonings or herbs instead of table salt or sea salt.  Check with your health care provider or pharmacist before using salt substitutes.  Eat lower-sodium products, often labeled as "lower sodium" or "no salt added."  Eat fresh foods.  Eat more vegetables, fruits, and low-fat dairy products.  Choose whole grains. Look for the word "whole" as the first word in the ingredient list.  Choose fish and skinless chicken or turkey more often than red meat. Limit fish, poultry, and meat to 6 oz (170 g) each day.  Limit sweets, desserts, sugars, and sugary drinks.  Choose heart-healthy fats.  Limit cheese to 1 oz (28 g) per day.  Eat more home-cooked food and less restaurant, buffet, and fast food.  Limit fried foods.  Cook foods using methods other than frying.  Limit canned vegetables. If you do use them, rinse them well to decrease the sodium.  When eating at a restaurant, ask that your food be prepared with less salt, or no salt if possible. WHAT FOODS CAN I EAT? Seek help from a dietitian for individual  calorie needs. Grains Whole grain or whole wheat bread. Brown rice. Whole grain or whole wheat pasta. Quinoa, bulgur, and whole grain cereals. Low-sodium cereals. Corn or whole wheat flour tortillas. Whole grain cornbread. Whole grain crackers. Low-sodium crackers. Vegetables Fresh or frozen vegetables (raw, steamed, roasted, or grilled). Low-sodium or reduced-sodium tomato and vegetable juices. Low-sodium or reduced-sodium tomato sauce and paste. Low-sodium or reduced-sodium canned vegetables.  Fruits All fresh, canned (in natural juice), or frozen fruits. Meat and Other Protein Products Ground beef (85% or leaner), grass-fed beef, or beef trimmed of fat. Skinless chicken or turkey. Ground chicken or turkey. Pork trimmed of fat. All fish and seafood. Eggs. Dried beans, peas, or lentils. Unsalted nuts and seeds. Unsalted canned beans. Dairy Low-fat dairy products, such as skim or 1% milk, 2% or reduced-fat cheeses, low-fat ricotta or cottage cheese, or plain low-fat yogurt. Low-sodium or reduced-sodium cheeses. Fats and Oils Tub margarines without trans fats. Light or reduced-fat mayonnaise and salad dressings (reduced sodium). Avocado. Safflower, olive, or canola oils. Natural peanut or almond butter. Other Unsalted popcorn and pretzels. The items listed above may not be a complete list of recommended foods or beverages. Contact your dietitian for more options. WHAT FOODS ARE NOT RECOMMENDED? Grains White bread.   White pasta. White rice. Refined cornbread. Bagels and croissants. Crackers that contain trans fat. Vegetables Creamed or fried vegetables. Vegetables in a cheese sauce. Regular canned vegetables. Regular canned tomato sauce and paste. Regular tomato and vegetable juices. Fruits Dried fruits. Canned fruit in light or heavy syrup. Fruit juice. Meat and Other Protein Products Fatty cuts of meat. Ribs, chicken wings, bacon, sausage, bologna, salami, chitterlings, fatback, hot dogs,  bratwurst, and packaged luncheon meats. Salted nuts and seeds. Canned beans with salt. Dairy Whole or 2% milk, cream, half-and-half, and cream cheese. Whole-fat or sweetened yogurt. Full-fat cheeses or blue cheese. Nondairy creamers and whipped toppings. Processed cheese, cheese spreads, or cheese curds. Condiments Onion and garlic salt, seasoned salt, table salt, and sea salt. Canned and packaged gravies. Worcestershire sauce. Tartar sauce. Barbecue sauce. Teriyaki sauce. Soy sauce, including reduced sodium. Steak sauce. Fish sauce. Oyster sauce. Cocktail sauce. Horseradish. Ketchup and mustard. Meat flavorings and tenderizers. Bouillon cubes. Hot sauce. Tabasco sauce. Marinades. Taco seasonings. Relishes. Fats and Oils Butter, stick margarine, lard, shortening, ghee, and bacon fat. Coconut, palm kernel, or palm oils. Regular salad dressings. Other Pickles and olives. Salted popcorn and pretzels. The items listed above may not be a complete list of foods and beverages to avoid. Contact your dietitian for more information. WHERE CAN I FIND MORE INFORMATION? National Heart, Lung, and Blood Institute: www.nhlbi.nih.gov/health/health-topics/topics/dash/ Document Released: 05/12/2011 Document Revised: 10/07/2013 Document Reviewed: 03/27/2013 ExitCare Patient Information 2015 [employer]. This information is not intended to replace advice given to you by your health care provider. Make sure you discuss any questions you have with your health care provider.  

## 2014-11-13 NOTE — ED Notes (Signed)
Pt was sent here by plasma donation staff for hypertension.  States he has tried to donate 5 times, but they keep telling him his pressure is too high.  States he took hctz in past for htn, but had lost a lot of weight and thought he was better.

## 2015-01-25 ENCOUNTER — Emergency Department (HOSPITAL_COMMUNITY)
Admission: EM | Admit: 2015-01-25 | Discharge: 2015-01-25 | Disposition: A | Discharge status: Home / Self Care | Payer: Self-pay | Attending: Emergency Medicine | Admitting: Emergency Medicine

## 2015-01-25 ENCOUNTER — Encounter (HOSPITAL_COMMUNITY): Payer: Self-pay | Admitting: *Deleted

## 2015-01-25 DIAGNOSIS — I10 Essential (primary) hypertension: Secondary | ICD-10-CM | POA: Insufficient documentation

## 2015-01-25 DIAGNOSIS — Z79899 Other long term (current) drug therapy: Secondary | ICD-10-CM | POA: Insufficient documentation

## 2015-01-25 DIAGNOSIS — L237 Allergic contact dermatitis due to plants, except food: Secondary | ICD-10-CM | POA: Insufficient documentation

## 2015-01-25 DIAGNOSIS — Z720 Tobacco use: Secondary | ICD-10-CM | POA: Insufficient documentation

## 2015-01-25 MED ORDER — PREDNISONE 20 MG PO TABS: 40.0000 mg | ORAL_TABLET | Freq: Every day | ORAL | Status: AC

## 2015-01-25 MED ORDER — DEXAMETHASONE SODIUM PHOSPHATE 10 MG/ML IJ SOLN
6.0000 mg | Freq: Once | INTRAMUSCULAR | Status: AC
  Administered 2015-01-25: 6 mg via INTRAMUSCULAR
  Filled 2015-01-25: qty 1

## 2015-01-25 MED ORDER — CALAMINE EX LOTN
1.0000 | TOPICAL_LOTION | CUTANEOUS | Status: AC | PRN
Start: 2015-01-25 — End: ?

## 2015-01-25 NOTE — Discharge Instructions (Signed)
Please take Prednisone taper as prescribed. You may apply Calamine lotion to affected areas for symptomatic relief.  Please return to the Emergency department if symptoms worsen.

## 2015-01-25 NOTE — ED Notes (Signed)
Pt in c/o rash to his forearms and hands, started after working outside, no distress noted

## 2015-01-25 NOTE — ED Provider Notes (Signed)
CSN: [id number]     Arrival date & time 01/25/15  1124 History   Chief Complaint  Patient presents with  . Rash   HPI Comments: Pt is a 49 yo male who presents to the ED with rash, onset 2 days. Pt reports he was cutting grass with a weedwacker 3 days ago. He states he noticed a rash 2 days ago to his arms and hands and reports that he may have been cutting poison ivy in the yard. Endorses intense itching to redness. Denies fever, SOB, abdominal pain, N/V, numbness, tingling, weakness. He notes that he he has not used any treatment PTA.  The history is provided by the patient. No language interpreter was used.     Past Medical History  Diagnosis Date  . Hypertension    History reviewed. No pertinent past surgical history. History reviewed. No pertinent family history. Social History  Substance Use Topics  . Smoking status: Current Every Day Smoker -- 0.50 packs/day    Types: Cigarettes  . Smokeless tobacco: None  . Alcohol Use: Yes    Review of Systems  Skin: Positive for rash.      Allergies  Review of patient's allergies indicates no known allergies.  Home Medications   Prior to Admission medications   Medication Sig Start Date End Date Taking? Authorizing Provider  clobetasol cream (TEMOVATE) 0.05 % Apply 1 application topically 2 (two) times daily. Patient not taking: Reported on 11/13/2014 10/17/13   Dahlia Client Muthersbaugh, PA-C  hydrochlorothiazide (HYDRODIURIL) 25 MG tablet Take 1 tablet (25 mg total) by mouth daily. 11/13/14   Gwyneth Sprout, MD  predniSONE (DELTASONE) 20 MG tablet 3 tabs po daily x 3 days, then 2 tabs x 3 days, then 1.5 tabs x 3 days, then 1 tab x 3 days, then 0.5 tabs x 3 days Patient not taking: Reported on 11/13/2014 10/17/13   Dahlia Client Muthersbaugh, PA-C   BP 165/106 mmHg  Pulse 51  Temp(Src) 97.7 F (36.5 C) (Oral)  Resp 18  Ht 5\' 5"  (1.651 m)  Wt 170 lb (77.111 kg)  BMI 28.29 kg/m2  SpO2 98% Physical Exam  Constitutional: He is oriented to  person, place, and time. He appears well-developed and well-nourished. No distress.  HENT:  Head: Normocephalic and atraumatic.  Eyes: Conjunctivae and EOM are normal. Right eye exhibits no discharge. Left eye exhibits no discharge. No scleral icterus.  Neck: Normal range of motion.  Cardiovascular: Normal rate.   Pulmonary/Chest: Effort normal. No respiratory distress.  Musculoskeletal: Normal range of motion.  Neurological: He is alert and oriented to person, place, and time.  Skin: Skin is warm and dry.  Multiple papules and vesicles with surrounding erythema noted on bilateral hands, forearms and left antecubital  region in a linear pattern, mild edema, excoriations present, no drainage.  Psychiatric: He has a normal mood and affect. His behavior is normal.  Nursing note and vitals reviewed.   ED Course  Procedures (including critical care time)  DIAGNOSTIC STUDIES: Oxygen Saturation is 98% on RA, normal by my interpretation.    COORDINATION OF CARE:    Labs Review Labs Reviewed - No data to display  Imaging Review No results found. I have personally reviewed and evaluated these images and lab results as part of my medical decision-making.  Filed Vitals:   01/25/15 1233  BP: 165/90  Pulse: 60  Temp: 98 F (36.7 C)  Resp: 16    Meds given in ED:  Medications  dexamethasone (DECADRON) injection 6 mg (  6 mg Intramuscular Given 01/25/15 1230)    New Prescriptions   CALAMINE LOTION    Apply 1 application topically as needed for itching.   PREDNISONE (DELTASONE) 20 MG TABLET    Take 2 tablets (40 mg total) by mouth daily. Take 40 mg by mouth daily for 3 days, then  by mouth daily for 3 days, then  daily for 3 days     MDM   Final diagnoses:  Poison ivy    Pt presents with rash on bilateral forearms and hands. Pt reports that he was doing yard work prior to onset of rash and notes he may have been in contact in poison ivy while he was cutting the  grass.  Rash is consistent with presentation of poison ivy with reported intense pruritis and linear papules and vesicles with surrounding erythema and mild swelling.   Pt given IM decadron for sxs.  Discussed plan for d/c with pt. Pt given rx for prednisone taper and calamine lotion. Pt advised to take medications as prescribed and to return to the ED if sxs worsen or new onset of fever, SOB, CP, numbness, tingling, weakness. Pt agrees with plan.   Satira Sark [place], [place] 01/25/15 1241  Pricilla Loveless, MD 01/29/15 [phone]

## 2015-01-25 NOTE — ED Notes (Signed)
Declined W/C at D/C and was escorted to lobby by RN. 

## 2015-05-01 ENCOUNTER — Encounter (HOSPITAL_COMMUNITY): Payer: Self-pay | Admitting: *Deleted

## 2015-05-01 ENCOUNTER — Emergency Department (HOSPITAL_COMMUNITY)
Admission: EM | Admit: 2015-05-01 | Discharge: 2015-05-01 | Disposition: A | Discharge status: Home / Self Care | Payer: Self-pay | Attending: Emergency Medicine | Admitting: Emergency Medicine

## 2015-05-01 DIAGNOSIS — F1910 Other psychoactive substance abuse, uncomplicated: Secondary | ICD-10-CM

## 2015-05-01 DIAGNOSIS — I10 Essential (primary) hypertension: Secondary | ICD-10-CM | POA: Insufficient documentation

## 2015-05-01 DIAGNOSIS — R45851 Suicidal ideations: Secondary | ICD-10-CM | POA: Insufficient documentation

## 2015-05-01 DIAGNOSIS — F1010 Alcohol abuse, uncomplicated: Secondary | ICD-10-CM | POA: Insufficient documentation

## 2015-05-01 DIAGNOSIS — Z79899 Other long term (current) drug therapy: Secondary | ICD-10-CM | POA: Insufficient documentation

## 2015-05-01 DIAGNOSIS — Z7952 Long term (current) use of systemic steroids: Secondary | ICD-10-CM | POA: Insufficient documentation

## 2015-05-01 DIAGNOSIS — F17210 Nicotine dependence, cigarettes, uncomplicated: Secondary | ICD-10-CM | POA: Insufficient documentation

## 2015-05-01 DIAGNOSIS — F1410 Cocaine abuse, uncomplicated: Secondary | ICD-10-CM | POA: Insufficient documentation

## 2015-05-01 MED ORDER — CARBAMAZEPINE 200 MG PO TABS: ORAL_TABLET | ORAL | Status: AC

## 2015-05-01 NOTE — Discharge Instructions (Signed)
Substance Abuse Treatment Programs ° °Intensive Outpatient Programs °[facility]     °[address]      °[place], [place]                   °[phone]      ° °[facility] °[address] °[place], [place] °[phone] ° ° Behavioral Health Outpatient     °(Inpatient and outpatient)     °[address]           °[phone]   ° °[facility] °[phone] (Suboxone and Methadone) ° °[address]      °[place], [place] [address]      °[phone]      ° °[address] °[place], [place] °852-3033 ° °[facility] (Outpatient/Inpatient, Chemical)    °(insurance only) [phone]      °       °[facility] (Groups & Residential) °[place] °[phone] ° °   °[facility]     °[address]     °[place], [place]      °[phone]      ° °[facility] (for caregivers and family) °[address] °[place], [place] °336-299-4655 ° ° ° ° ° °Residential Treatment Programs °[facility]      °[address], [place], [place] [address]  °[phone]      ° °[facility] °[address] [place], [place] [address] °[phone] ° °[facility]        °[phone]      ° °[facility] °[phone] ° °[facility]             °[address]                                         °[place], [place]                                                °[phone] or [phone]                              ° °[facility] [place] °[address] °[place], [address] °[phone] ° °[facility]    °[address]      °[place], [place]     °[phone]      ° °[facility] °[address] °[place], [place] °[phone] ° °[facility]   °[address]     °[place], [place] [address]     °[phone]      °Admissions: 8am-3pm M-F ° °[facility] °[address] °[place] °[phone] ° °[facility]: Residential Program (90 Days)   °[place], [place]      °336-725-8389 or [phone]    ° °[facility] °[place], [place] °(Walk in Hours over the weekend or by referral) ° °[facility] °[address], [place],  [address] °([phone] ° °Crisis Mobile: [facility]:  1-877-626-1772 (for crisis response 24 hours a day) °[facility] Hotline:      1-800-256-2452 °Outpatient Psychiatry and Counseling ° °[facility]: Mobile Crisis   Management 24 hours:  1-877-626-1772 ° °[facility] sliding scale fee and walk in schedule: M-F 8am-12pm/1pm-3pm °[address]  °[place], [place] [address] °[phone] ° °[facility] °[address] °[place], [place] [address] °[phone] ° °[facility] ([facility] known [facility])- new patient walk-in appointments available Monday - Friday 8am -3pm.          °[address] °[place], [place] [address] °336-676-6840 or crisis line- 336-676-6905 ° °[facility] Intensive Outpatient Therapy Program °[address] °[place], [place] [address] °336-832-9804 ° °[facility]                  °Crisis Services      °[phone]      °[address]     °[place], [place] [address]                ° °[facility]   °[facility] °[phone] °[address] °[place], [place] [address] ° ° °[facility]          °[address]  °[place], [place] [address]       °[phone] ° °[facility] °[address] °[place], [place] [address] °336-292-1510 ° °[facility]    °[address]    °[place], [place] [address]     °[phone]      ° °Parish McKinney, MD     °[address]     °[place] [address]     °[phone]     °  °[facility] °[address] °[place] [address] ° °[facility]     °[address]     °[place], [place]      °336-542-2076      ° °[facility] °Shamsher Ahluwalia, MD °[address] °[place], [place] [address] °[phone] ° °[facility]     °[address]     °[place], [place] [address]     °336-274-1237      ° °[facility] °[address] °[place], [place] [address] °336-854-4450 °Resources for Temporary Residential Assistance/Crisis Centers ° °DAY CENTERS °[facility] °M-F 8am-3pm   °[address] [place], [place] [address]   336-332-0824 °Services include: laundry, barbering, support groups, case management, phone  & computer access, showers, AA/NA mtgs, mental health/substance abuse nurse, job skills class, disability information, VA assistance, spiritual classes, etc.  ° °HOMELESS SHELTERS ° °[facility]     °[facility]   °[address], [place]     °[phone]       °       °[facility] (women and children)       °[address] °[place], [place] [address] °336-275-0820 °[email or web address] for application and process °Application Required ° °[facility]   °[address]    °[place] [address]     °[phone]       °             °[facility] °[address] °, [place] [address] °336.273.5572 °336-235-0363(schedule application appt.) °Application Required ° °[facility] (women only)    °[address]     °[place],  [address]     °336-884-1039      °  Intake starts 6pm daily °Need valid ID, SSC, & Police report °[facility] [place] °[address] °[place], [place] °336-881-5420 °Application Required ° °[facility] (men only)     °[address]      °[place], [place]     °[phone]      ° °[facility] °(Pregnant women only) °[address] °[place], [place] °336-275-0206 ° °[facility]      °[address]      °[place], [place] [address]     °[phone]      °       °[facility] °[address] °[place], [place] °336-723-1848 °90 day commitment/SA/Application process ° °[facility](men only)     °[address]     °[place], [place]     °[phone]       °Check-in at 7pm     °       °[facility] °[address] °[place], [place] [address] °336-248-6684 °Men/Women/Women and Children must be there by 7 pm ° °[facility] °[place],  °336-722-8721                ° °

## 2015-05-01 NOTE — ED Notes (Signed)
Pt is coming in to detox until he can find a program, Pt wants to detox from crack cocaine, last use 1 hour ago.  Wants help with ETOH- only does socially.  No SI/HI.

## 2015-05-01 NOTE — ED Provider Notes (Signed)
CSN: [id number]     Arrival date & time 05/01/15  1113 History  By signing my name below, I, Edward Golden, attest that this documentation has been prepared under the direction and in the presence of Edward HelperBowie Benn Tarver, PA-C. Electronically Signed: Angelene GiovanniEmmanuella Golden, ED Scribe. 05/01/2015. 12:29 PM.      Chief Complaint  Patient presents with  . Drug Problem   The history is provided by the patient. No language interpreter was used.   HPI Comments: Edward Golden is a 49 y.o. male with a hx of HTN who presents to the Emergency Department for detox of crack cocaine use onset 2 days ago. He reports associated mild passive SI with no plan. He adds that his SI has since resolved. He denies any hallucinations. He states that he has not been able to eat appropriately due to the drugs. He reports that his last use was an hour PTA. Pt reports that his first time using crack cocaine was a month ago since his successful rehab program in 2006. He states that he also drank approx 12 pack of beer in total for the 2 days. He reports that he started the drugs again after being in a new relationship.   Past Medical History  Diagnosis Date  . Hypertension    History reviewed. No pertinent past surgical history. No family history on file. Social History  Substance Use Topics  . Smoking status: Current Every Day Smoker -- 0.50 packs/day    Types: Cigarettes  . Smokeless tobacco: None  . Alcohol Use: Yes    Review of Systems  Constitutional: Negative for fever.  Psychiatric/Behavioral: Positive for suicidal ideas (passive). Negative for hallucinations.      Allergies  Review of patient's allergies indicates no known allergies.  Home Medications   Prior to Admission medications   Medication Sig Start Date End Date Taking? Authorizing Provider  calamine lotion Apply 1 application topically as needed for itching. 01/25/15   Barrett HenleNicole Elizabeth Nadeau, PA-C  clobetasol cream (TEMOVATE) 0.05 % Apply 1  application topically 2 (two) times daily. Patient not taking: Reported on 11/13/2014 10/17/13   Dahlia ClientHannah Muthersbaugh, PA-C  hydrochlorothiazide (HYDRODIURIL) 25 MG tablet Take 1 tablet (25 mg total) by mouth daily. 11/13/14   Gwyneth SproutWhitney Plunkett, MD  predniSONE (DELTASONE) 20 MG tablet Take 2 tablets (40 mg total) by mouth daily. Take 40 mg by mouth daily for 3 days, then 20mg  by mouth daily for 3 days, then 10mg  daily for 3 days 01/25/15   Satira SarkNicole Elizabeth Nadeau, PA-C   BP 163/113 mmHg  Pulse 86  Temp(Src) 98.7 F (37.1 C) (Oral)  Resp 18  SpO2 97% Physical Exam  Constitutional: He is oriented to person, place, and time. He appears well-developed and well-nourished. No distress.  HENT:  Head: Normocephalic and atraumatic.  Eyes: Conjunctivae and EOM are normal.  Neck: Neck supple. No tracheal deviation present.  Cardiovascular: Normal rate and regular rhythm.   Pulmonary/Chest: Effort normal. No respiratory distress.  Abdominal: Soft.  Musculoskeletal: Normal range of motion.  Neurological: He is alert and oriented to person, place, and time.  Skin: Skin is warm and dry.  Psychiatric: He has a normal mood and affect. His behavior is normal.  Nursing note and vitals reviewed.   ED Course  Procedures (including critical care time) DIAGNOSTIC STUDIES: Oxygen Saturation is 97% on RA, adequate by my interpretation.    COORDINATION OF CARE: 12:24 PM- Pt advised of plan for treatment and pt agrees. Explained that ER  no longer has a detox program. Will provide resources to follow up with the detox clinic. Advised to call for an appointment in 2 days.     MDM   Final diagnoses:  Polysubstance abuse    BP 163/113 mmHg  Pulse 86  Temp(Src) 98.7 F (37.1 C) (Oral)  Resp 18  SpO2 97%  I personally performed the services described in this documentation, which was scribed in my presence. The recorded information has been reviewed and is accurate.     Edward Helper, PA-C 05/01/15  1603  Alvira Monday, MD 05/01/15 2023

## 2015-05-01 NOTE — ED Notes (Signed)
See PA assessment, PA at bedside with RN 

## 2015-09-05 DEATH — deceased

## 2018-06-18 ENCOUNTER — Ambulatory Visit: Payer: Self-pay | Admitting: Nurse Practitioner
